# Patient Record
Sex: Male | Born: 2000 | Race: White | Hispanic: No | Marital: Single
Health system: Southern US, Community
[De-identification: ages and names within clinical notes are randomized; demographics above are authoritative.]

---
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
## 2019-06-14 ENCOUNTER — Ambulatory Visit: Payer: HRSA Program | Attending: Internal Medicine

## 2019-06-14 DIAGNOSIS — Z20822 Contact with and (suspected) exposure to covid-19: Secondary | ICD-10-CM

## 2019-06-14 DIAGNOSIS — Z20828 Contact with and (suspected) exposure to other viral communicable diseases: Secondary | ICD-10-CM | POA: Insufficient documentation

## 2019-06-15 LAB — NOVEL CORONAVIRUS, NAA: SARS-CoV-2, NAA: NOT DETECTED

## 2019-11-07 ENCOUNTER — Ambulatory Visit: Payer: Self-pay | Attending: Internal Medicine

## 2019-11-07 DIAGNOSIS — Z23 Encounter for immunization: Secondary | ICD-10-CM

## 2019-11-07 NOTE — Progress Notes (Signed)
   Covid-19 Vaccination Clinic  Name:  Derek Montgomery    MRN: [id number] DOB: [birth year]  11/07/2019  Mr. Derek Montgomery was observed post Covid-19 immunization for 15 minutes without incident. He was provided with Vaccine Information Sheet and instruction to access the V-Safe system.   Mr. Derek Montgomery was instructed to call 911 with any severe reactions post vaccine: Marland Kitchen Difficulty breathing  . Swelling of face and throat  . A fast heartbeat  . A bad rash all over body  . Dizziness and weakness   Immunizations Administered    Name Date Dose VIS Date Route   Moderna COVID-19 Vaccine 11/07/2019  4:42 PM 0.5 mL 05/2019 Intramuscular   Manufacturer: Moderna   Lot: [id number]   NDC: 34373-578-97
